# Patient Record
Sex: Male | Born: 1980 | Race: White | Hispanic: No | Marital: Single | Smoking: Current every day smoker
Health system: Southern US, Community
[De-identification: ages and names within clinical notes are randomized; demographics above are authoritative.]

## PROBLEM LIST (undated history)

## (undated) DIAGNOSIS — K219 Gastro-esophageal reflux disease without esophagitis: Secondary | ICD-10-CM

---

## 2005-08-05 ENCOUNTER — Emergency Department: Payer: Self-pay | Admitting: Internal Medicine

## 2005-08-06 ENCOUNTER — Ambulatory Visit: Payer: Self-pay | Admitting: Internal Medicine

## 2006-01-03 ENCOUNTER — Emergency Department: Payer: Self-pay | Admitting: Emergency Medicine

## 2006-01-09 ENCOUNTER — Emergency Department: Payer: Self-pay

## 2006-01-09 ENCOUNTER — Other Ambulatory Visit: Payer: Self-pay

## 2006-10-28 ENCOUNTER — Emergency Department: Payer: Self-pay | Admitting: Emergency Medicine

## 2006-11-21 ENCOUNTER — Inpatient Hospital Stay: Payer: Self-pay | Admitting: Internal Medicine

## 2007-03-23 ENCOUNTER — Emergency Department: Payer: Self-pay | Admitting: Emergency Medicine

## 2007-12-25 ENCOUNTER — Ambulatory Visit: Payer: Self-pay | Admitting: Family Medicine

## 2008-01-14 ENCOUNTER — Other Ambulatory Visit: Payer: Self-pay

## 2008-01-14 ENCOUNTER — Emergency Department: Payer: Self-pay | Admitting: Emergency Medicine

## 2008-07-14 ENCOUNTER — Emergency Department: Payer: Self-pay | Admitting: Unknown Physician Specialty

## 2008-09-10 ENCOUNTER — Emergency Department: Payer: Self-pay | Admitting: Internal Medicine

## 2008-11-07 ENCOUNTER — Emergency Department: Payer: Self-pay | Admitting: Emergency Medicine

## 2009-02-01 ENCOUNTER — Emergency Department: Payer: Self-pay | Admitting: Emergency Medicine

## 2009-10-01 ENCOUNTER — Emergency Department: Payer: Self-pay | Admitting: Emergency Medicine

## 2010-01-10 IMAGING — CR DG ABDOMEN 3V
1 series · 4 of 4 positions shown · non-contrast
Comparison: none

REASON FOR EXAM: abdominal pain vomiting
COMMENTS:

PROCEDURE:     DXR - DXR ABDOMEN 3-WAY (INCL PA CXR)  - September 10, 2008 [DATE]
RESULT:     The lungs are clear. The heart and pulmonary vessels are normal.
The bony and mediastinal structures are unremarkable. The bowel gas pattern
is normal. There is no obstruction or perforation evident.

[Series 1: view not recorded · 0.17mm/px · 4 of 4 slices shown]
[im 1/4]
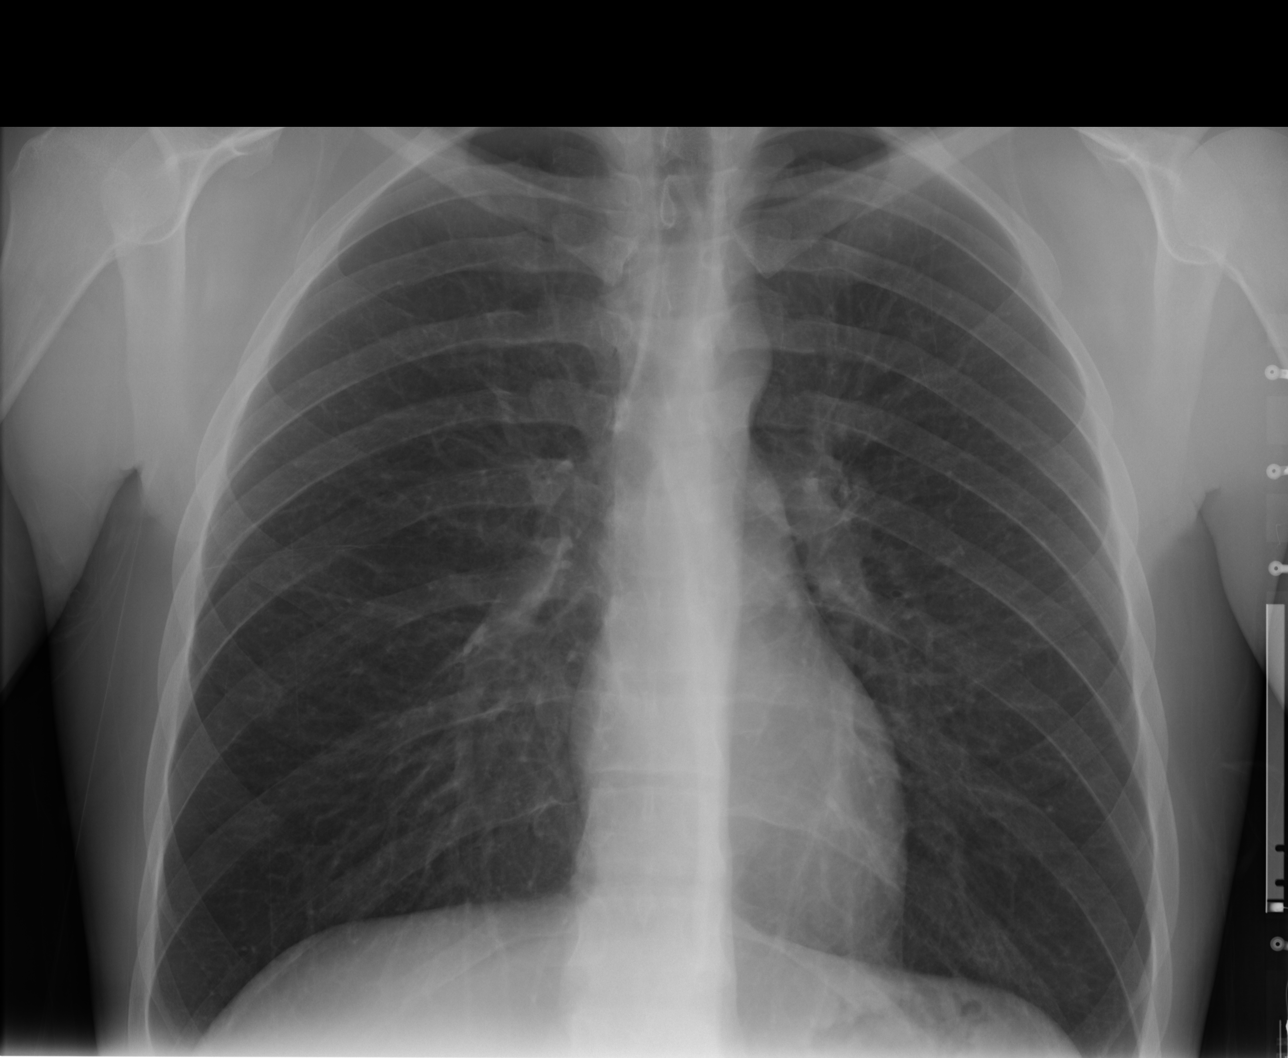
[im 2/4]
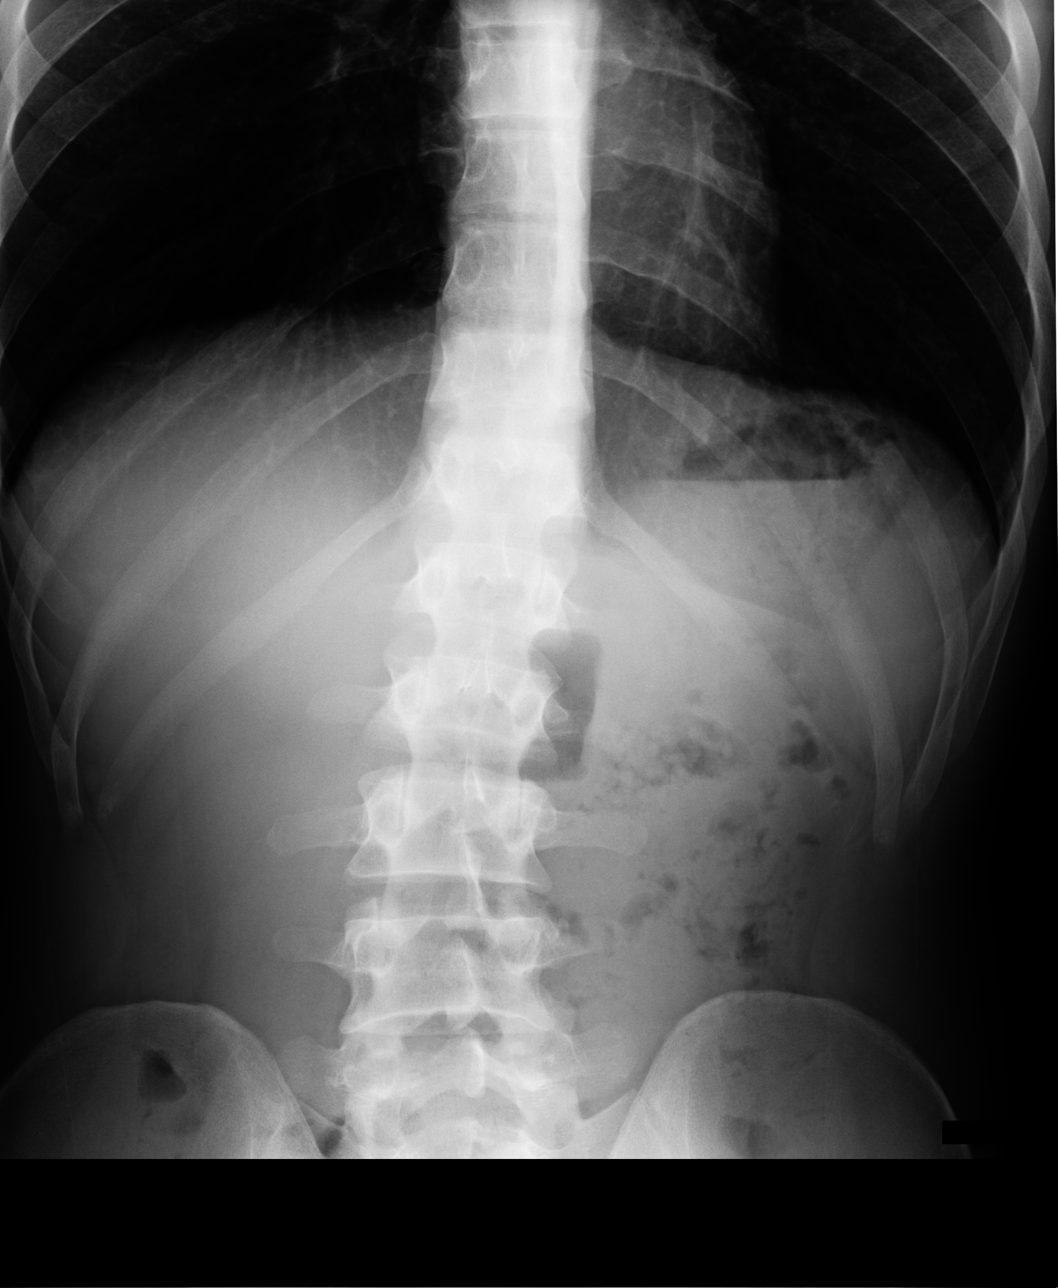
[im 3/4]
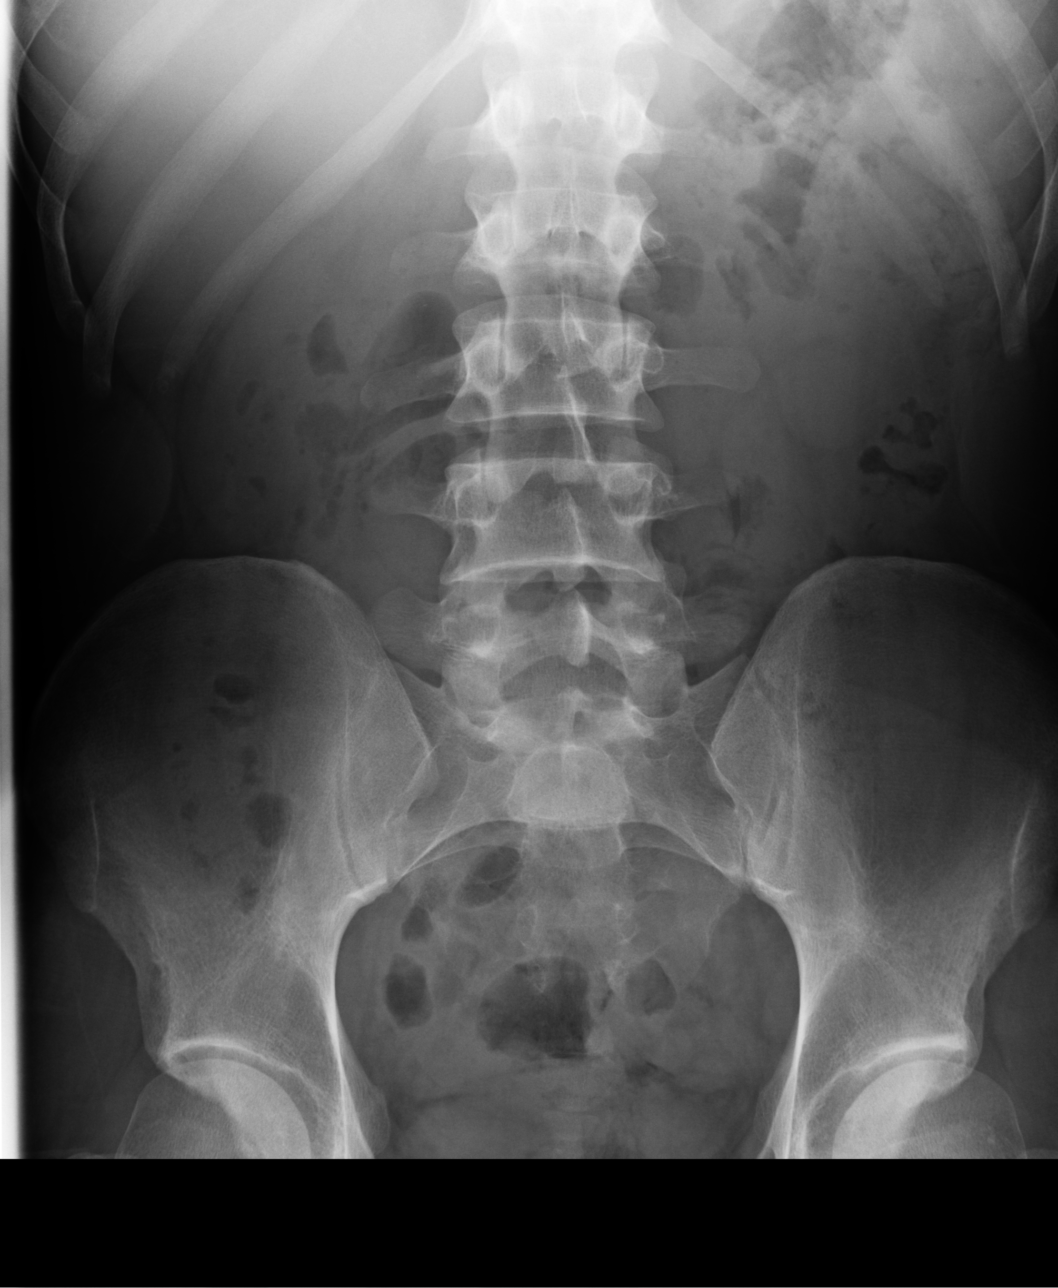
[im 4/4]
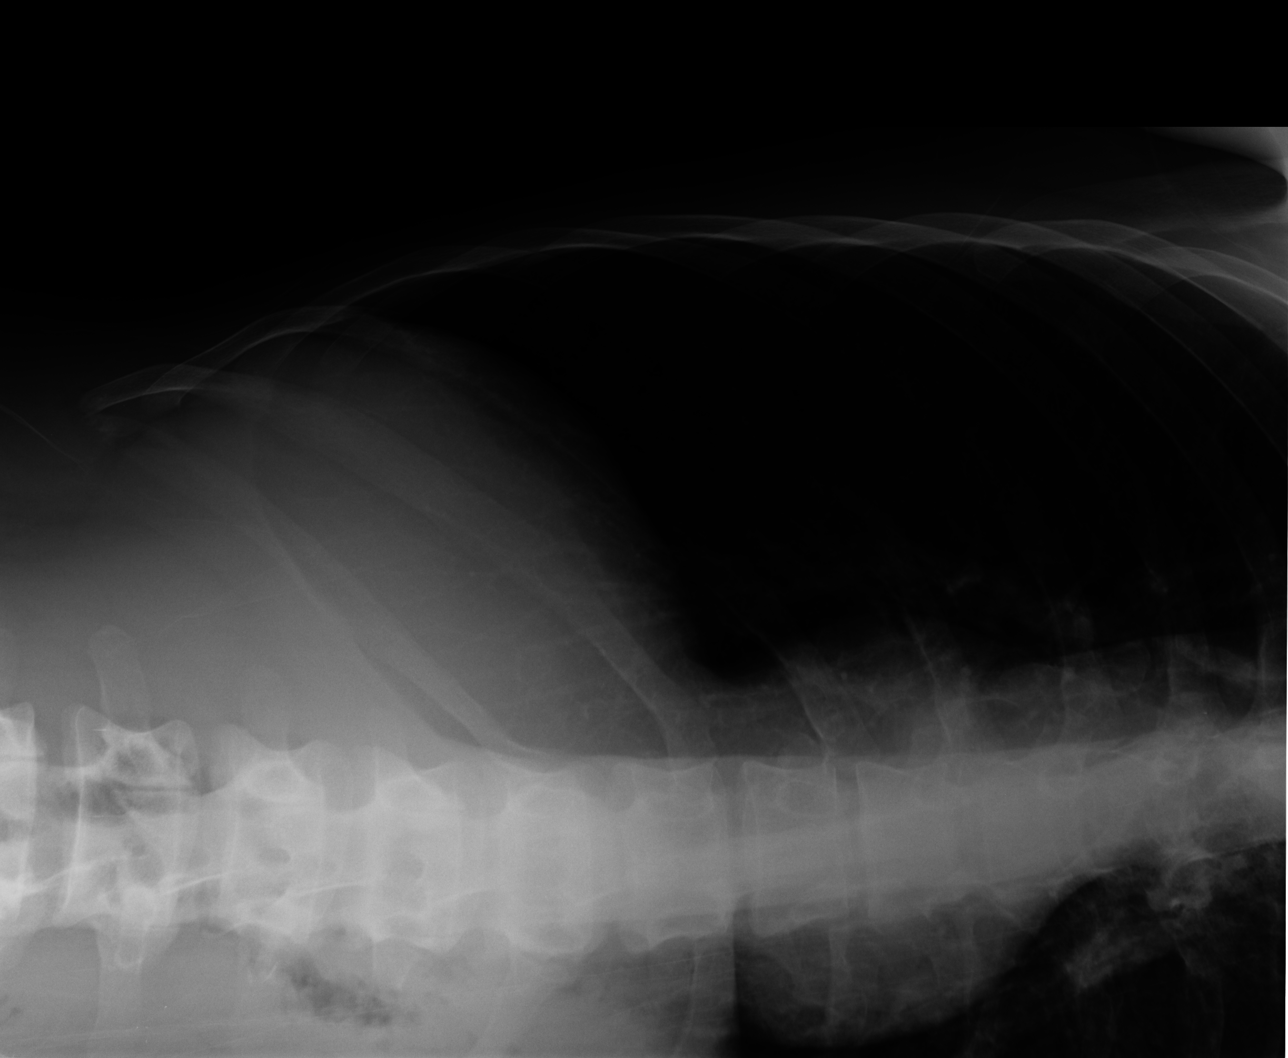

[4 of 4 positions shown; findings below may reference images not displayed]

IMPRESSION: No acute cardiopulmonary disease. No evidence of bowel
obstruction or perforation.

## 2010-06-04 IMAGING — CT CT ANGIOGRAPHY HEAD
1 of 5 series · 6 of 20 positions shown · IV contrast (isovue)
Comparison: none

REASON FOR EXAM: CT angiogram pt with bloody tap and had severe HA
yesterday
COMMENTS:

PROCEDURE:     CT  - CT ANGIOGRAPHY HEAD W/WO  - February 02, 2009 [DATE]
RESULT:     Study: CT angiogram of the circle-of-Willis dated
Comparisons: None
INDICATION: Severe headache. Bloody CSF.
TECHNIQUE: 100 mL of Isovue-O7C were administered and the intracranial
carotid arteries bilaterally were scanned during the arterial phase from the
vertex to the skull base.

[Series 6: 1mm soft tissue · axial · 0.38mm/px · z∈[+796,+907]mm · 6 of 157 slices shown]
[im 23/157  soft-tissue]
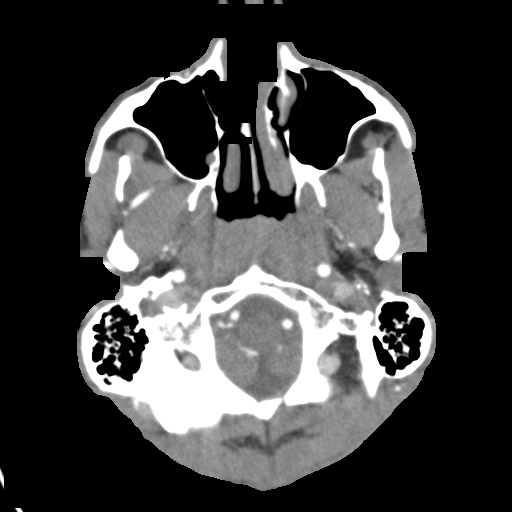
[im 45/157  bone]
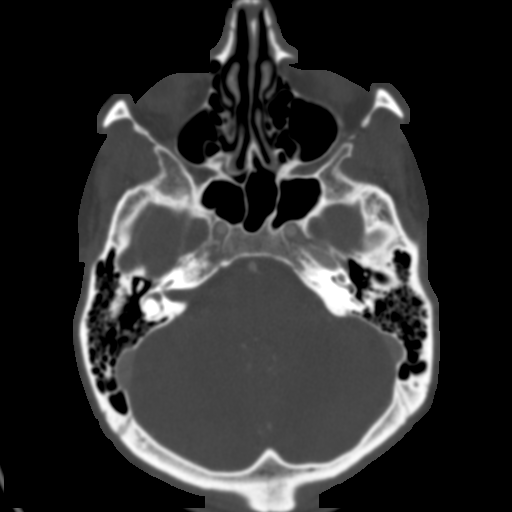
[im 67/157  soft-tissue]
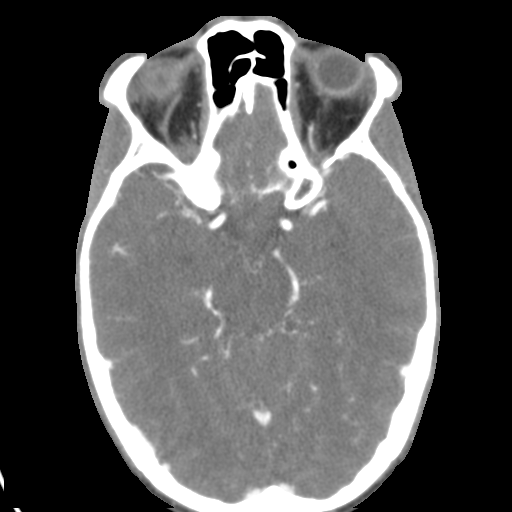
[im 90/157  bone]
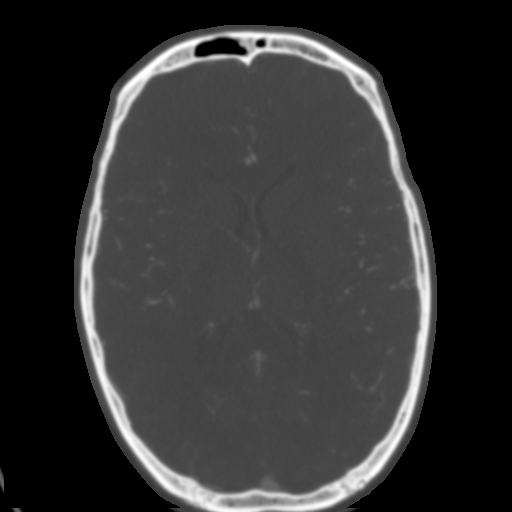
[im 112/157  soft-tissue]
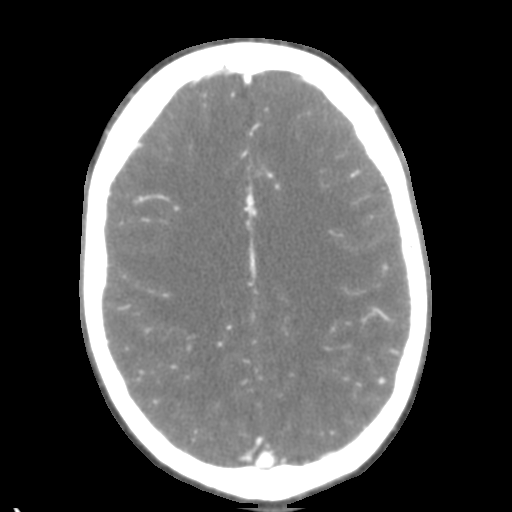
[im 134/157  bone]
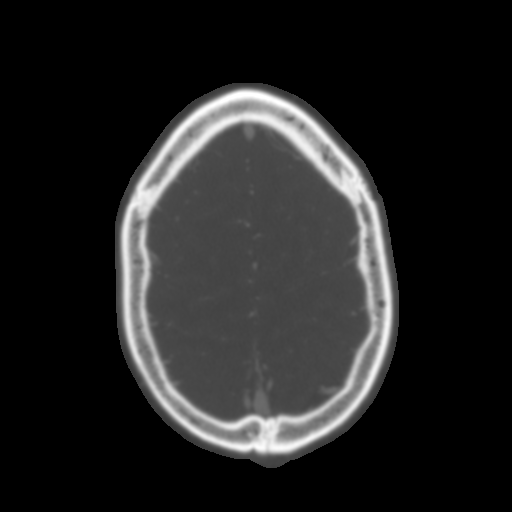

[6 of 20 positions shown; findings below may reference images not displayed]

FINDINGS: Intracranial circulation/circle-of-Willis:

Anterior: The internal carotid is identified and is patent bilaterally from
the skull base to the bifurcation into the middle cerebral and anterior
cerebral arteries. The anterior cerebral, anterior communicating, and
posterior communicating arteries are normal.

Posterior: The vertebral arteries are codominant and normal bilaterally from
the base of the skull to their confluence at the basilar artery. The
posterior cerebral arteries, superior cerebellar arteries, AICAs, and PICAs
are demonstrated and are unremarkable.

The visualized portions of the brain are unremarkable.
IMPRESSION: Normal circle of Willis.

No evidence of an intracranial aneurysm.

## 2010-08-15 ENCOUNTER — Emergency Department: Payer: Self-pay | Admitting: Emergency Medicine

## 2013-08-04 ENCOUNTER — Emergency Department: Payer: Self-pay | Admitting: Emergency Medicine

## 2014-05-25 ENCOUNTER — Emergency Department: Payer: Self-pay | Admitting: Emergency Medicine

## 2014-08-23 ENCOUNTER — Emergency Department: Payer: Self-pay | Admitting: Emergency Medicine

## 2015-02-24 ENCOUNTER — Encounter: Payer: Self-pay | Admitting: Emergency Medicine

## 2015-02-24 ENCOUNTER — Emergency Department
Admission: EM | Admit: 2015-02-24 | Discharge: 2015-02-24 | Disposition: A | Discharge status: Home / Self Care | Payer: Self-pay | Attending: Emergency Medicine | Admitting: Emergency Medicine

## 2015-02-24 DIAGNOSIS — K219 Gastro-esophageal reflux disease without esophagitis: Secondary | ICD-10-CM | POA: Insufficient documentation

## 2015-02-24 DIAGNOSIS — Z720 Tobacco use: Secondary | ICD-10-CM | POA: Insufficient documentation

## 2015-02-24 HISTORY — DX: Gastro-esophageal reflux disease without esophagitis: K21.9

## 2015-02-24 LAB — CBC
HEMATOCRIT: 46.6 % (ref 40.0–52.0)
HEMOGLOBIN: 15.7 g/dL (ref 13.0–18.0)
MCH: 29.1 pg (ref 26.0–34.0)
MCHC: 33.6 g/dL (ref 32.0–36.0)
MCV: 86.5 fL (ref 80.0–100.0)
Platelets: 202 10*3/uL (ref 150–440)
RBC: 5.39 MIL/uL (ref 4.40–5.90)
RDW: 13.7 % (ref 11.5–14.5)
WBC: 8.9 10*3/uL (ref 3.8–10.6)

## 2015-02-24 LAB — COMPREHENSIVE METABOLIC PANEL
ALT: 14 U/L — ABNORMAL LOW (ref 17–63)
AST: 18 U/L (ref 15–41)
Albumin: 4.4 g/dL (ref 3.5–5.0)
Alkaline Phosphatase: 78 U/L (ref 38–126)
Anion gap: 7 (ref 5–15)
BILIRUBIN TOTAL: 0.4 mg/dL (ref 0.3–1.2)
BUN: 9 mg/dL (ref 6–20)
CO2: 26 mmol/L (ref 22–32)
Calcium: 9.5 mg/dL (ref 8.9–10.3)
Chloride: 106 mmol/L (ref 101–111)
Creatinine, Ser: 1.12 mg/dL (ref 0.61–1.24)
GFR calc Af Amer: >60 mL/min (ref >60)
Glucose, Bld: 84 mg/dL (ref 65–99)
POTASSIUM: 4.3 mmol/L (ref 3.5–5.1)
Sodium: 139 mmol/L (ref 135–145)
TOTAL PROTEIN: 7.3 g/dL (ref 6.5–8.1)

## 2015-02-24 LAB — URINALYSIS COMPLETE WITH MICROSCOPIC (ARMC ONLY)
BACTERIA UA: NONE SEEN
Bilirubin Urine: NEGATIVE
Glucose, UA: NEGATIVE mg/dL
Hgb urine dipstick: NEGATIVE
Ketones, ur: NEGATIVE mg/dL
Leukocytes, UA: NEGATIVE
Nitrite: NEGATIVE
PROTEIN: NEGATIVE mg/dL
RBC / HPF: NONE SEEN RBC/hpf (ref 0–5)
SQUAMOUS EPITHELIAL / LPF: NONE SEEN
Specific Gravity, Urine: 1.005 (ref 1.005–1.030)
pH: 6 (ref 5.0–8.0)

## 2015-02-24 LAB — LIPASE, BLOOD: Lipase: 21 U/L — ABNORMAL LOW (ref 22–51)

## 2015-02-24 MED ORDER — OMEPRAZOLE 20 MG PO CPDR: 20.0000 mg | DELAYED_RELEASE_CAPSULE | Freq: Every day | ORAL | Status: DC

## 2015-02-24 NOTE — ED Provider Notes (Signed)
[facility] Emergency Department Provider Note  ____________________________________________  Time seen: On arrival  I have reviewed the triage vital signs and the nursing notes.   HISTORY  Chief Complaint Abdominal Pain    HPI Curtis Bryant is a 34 y.o. male who presents with complaints of GERD causing nausea. Patient reports a long history of GERD occasionally has difficulty affording his antiacid medication and when he is not taking it he frequently gets nauseous and sometimes he vomits to make himself feel better. He was seen and vomiting today at work and his boss sent him home and refused to let him work without a work note. Patient denies chest pain he denies abdominal pain. He feels well and has no real complaints except for his typical "acid feeling "    Past Medical History  Diagnosis Date  . GERD (gastroesophageal reflux disease)     There are no active problems to display for this patient.   History reviewed. No pertinent past surgical history.  Current Outpatient Rx  Name  Route  Sig  Dispense  Refill  . omeprazole (PRILOSEC) 20 MG capsule   Oral   Take 1 capsule (20 mg total) by mouth daily.   30 capsule   1     Allergies Review of patient's allergies indicates no known allergies.  No family history on file.  Social History Social History  Substance Use Topics  . Smoking status: Current Every Day Smoker  . Smokeless tobacco: None  . Alcohol Use: Yes    Review of Systems  Constitutional: Negative for fever.  ENT: Negative for sore throat  Abdominal: No pain, mild nausea Musculoskeletal: Negative for back pain. Skin: Negative for rash. Neurological: Negative for headaches or focal weakness   ____________________________________________   PHYSICAL EXAM:  VITAL SIGNS: ED Triage Vitals  Enc Vitals Group     BP --      Pulse Rate 02/24/15 0852 60     Resp 02/24/15 0852 16     Temp 02/24/15 0852 97.6 F (36.4  C)     Temp Source 02/24/15 0852 Oral     SpO2 02/24/15 0852 98 %     Weight 02/24/15 0852 155 lb (70.308 kg)     Height 02/24/15 0852  (1.93 m)     Head Cir --      Peak Flow --      Pain Score 02/24/15 0852 6     Pain Loc --      Pain Edu? --      Excl. in GC? --      Constitutional: Alert and oriented. Well appearing and in no distress. Eyes: Conjunctivae are normal.  ENT   Head: Normocephalic and atraumatic.   Mouth/Throat: Mucous membranes are moist. Cardiovascular: Normal rate, regular rhythm.  Respiratory: Normal respiratory effort without tachypnea nor retractions.  Gastrointestinal: Soft and non-tender in all quadrants. No distention. There is no CVA tenderness. Musculoskeletal: Nontender with normal range of motion in all extremities. Neurologic:  Normal speech and language. No gross focal neurologic deficits are appreciated. Skin:  Skin is warm, dry and intact. No rash noted. Psychiatric: Mood and affect are normal. Patient exhibits appropriate insight and judgment.  ____________________________________________    LABS (pertinent positives/negatives)  Labs Reviewed  LIPASE, BLOOD - Abnormal; Notable for the following:    Lipase 21 (*)    All other components within normal limits  COMPREHENSIVE METABOLIC PANEL - Abnormal; Notable for the following:  ALT 14 (*)    All other components within normal limits  URINALYSIS COMPLETEWITH MICROSCOPIC ([facility] ONLY) - Abnormal; Notable for the following:    Color, Urine STRAW (*)    APPearance CLEAR (*)    All other components within normal limits  CBC    ____________________________________________     ____________________________________________    RADIOLOGY I have personally reviewed any xrays that were ordered on this patient: None  ____________________________________________   PROCEDURES  Procedure(s) performed: none   ____________________________________________   INITIAL  IMPRESSION / ASSESSMENT AND PLAN / ED COURSE  Pertinent labs & imaging results that were available during my care of the patient were reviewed by me and considered in my medical decision making (see chart for details).  Patient well-appearing and in no distress. Vital signs are unremarkable. He has no abdominal tenderness to palpation. I printed out a good Rx coupon for Prilosec to help him afford his medications. I have written a work note to allow him to return to work today as he is well-appearing and I do not see any reason he cannot work  ____________________________________________   FINAL CLINICAL IMPRESSION(S) / ED DIAGNOSES  Final diagnoses:  Gastroesophageal reflux disease, esophagitis presence not specified     Jene Every, MD 02/24/15 [phone]

## 2015-02-24 NOTE — Discharge Instructions (Signed)
Gastroesophageal Reflux Disease, Adult °Gastroesophageal reflux disease (GERD) happens when acid from your stomach goes into your food pipe (esophagus). The acid can cause a burning feeling in your chest. Over time, the acid can make small holes (ulcers) in your food pipe.  °HOME CARE °· Ask your doctor for advice about: °¨ Losing weight. °¨ Quitting smoking. °¨ Alcohol use. °· Avoid foods and drinks that make your problems worse. You may want to avoid: °¨ Caffeine and alcohol. °¨ Chocolate. °¨ Mints. °¨ Garlic and onions. °¨ Spicy foods. °¨ Citrus fruits, such as oranges, lemons, or limes. °¨ Foods that contain tomato, such as sauce, chili, salsa, and pizza. °¨ Fried and fatty foods. °· Avoid lying down for 3 hours before you go to bed or before you take a nap. °· Eat small meals often, instead of large meals. °· Wear loose-fitting clothing. Do not wear anything tight around your waist. °· Raise (elevate) the head of your bed 6 to 8 inches with wood blocks. Using extra pillows does not help. °· Only take medicines as told by your doctor. °· Do not take aspirin or ibuprofen. °GET HELP RIGHT AWAY IF:  °· You have pain in your arms, neck, jaw, teeth, or back. °· Your pain gets worse or changes. °· You feel sick to your stomach (nauseous), throw up (vomit), or sweat (diaphoresis). °· You feel short of breath, or you pass out (faint). °· Your throw up is green, yellow, black, or looks like coffee grounds or blood. °· Your poop (stool) is red, bloody, or black. °MAKE SURE YOU:  °· Understand these instructions. °· Will watch your condition. °· Will get help right away if you are not doing well or get worse. °Document Released: 12/05/2007 Document Revised: 09/10/2011 Document Reviewed: 01/05/2011 °[employer]® Patient Information ©2015 [employer]. This information is not intended to replace advice given to you by your health care provider. Make sure you discuss any questions you have with your health care provider. ° °

## 2015-02-24 NOTE — ED Notes (Signed)
MD at bedside. 

## 2015-02-24 NOTE — ED Notes (Signed)
Says he has gerd and has rx for anti acid med.  He cannot afford it.  Says it started with upper abd pain Tuesday. Today is vomiting yellow emesis.  He vomited again at work today and his boss told him to go and get a doctors note.

## 2016-03-28 ENCOUNTER — Encounter: Payer: Self-pay | Admitting: *Deleted

## 2016-03-28 ENCOUNTER — Ambulatory Visit
Admission: EM | Admit: 2016-03-28 | Discharge: 2016-03-28 | Disposition: A | Discharge status: Home / Self Care | Payer: 59 | Attending: Family Medicine | Admitting: Family Medicine

## 2016-03-28 DIAGNOSIS — J984 Other disorders of lung: Secondary | ICD-10-CM

## 2016-03-28 DIAGNOSIS — K219 Gastro-esophageal reflux disease without esophagitis: Secondary | ICD-10-CM

## 2016-03-28 MED ORDER — FLUTICASONE PROPIONATE HFA 110 MCG/ACT IN AERO: 1.0000 | INHALATION_SPRAY | Freq: Two times a day (BID) | RESPIRATORY_TRACT | 12 refills | Status: DC

## 2016-03-28 MED ORDER — ESOMEPRAZOLE MAGNESIUM 40 MG PO CPDR: 40.0000 mg | DELAYED_RELEASE_CAPSULE | Freq: Two times a day (BID) | ORAL | 0 refills | Status: DC

## 2016-03-28 MED ORDER — ALBUTEROL SULFATE HFA 108 (90 BASE) MCG/ACT IN AERS: 2.0000 | INHALATION_SPRAY | Freq: Four times a day (QID) | RESPIRATORY_TRACT | 0 refills | Status: DC | PRN

## 2016-03-28 MED ORDER — IPRATROPIUM-ALBUTEROL 0.5-2.5 (3) MG/3ML IN SOLN
3.0000 mL | Freq: Once | RESPIRATORY_TRACT | Status: AC
  Administered 2016-03-28: 3 mL via RESPIRATORY_TRACT

## 2016-03-28 NOTE — ED Provider Notes (Signed)
CSN: [id number]     Arrival date & time 03/28/16  4098 History   First MD Initiated Contact with Patient 03/28/16 [phone]     Chief Complaint  Patient presents with  . Cough  . Sore Throat   (Consider location/radiation/quality/duration/timing/severity/associated sxs/prior Treatment) 35 year old male presents with burning in stomach, cough, chest congestion and sore throat for the past few months. Has been previously diagnosed with GERD and is suppose to be taking a PPI but has not taken any medication in over 2 years. Also went to [facility] 2 months ago and was placed on 2 inhalers for ?asthma/COPD but never got them filled. He did have a chest x-ray 2 months ago which pt says was unremarkable. He has smoked for almost 20 years and is not ready to quit.    The history is provided by the patient and a significant other.  Cough  Cough characteristics:  Productive Sputum characteristics:  Clear Severity:  Moderate Onset quality:  Gradual Duration:  12 weeks Timing:  Intermittent Progression:  Unchanged Chronicity:  New Smoker: yes   Context: occupational exposure and smoke exposure   Relieved by:  None tried Worsened by:  Smoking and environmental changes Ineffective treatments:  None tried Associated symptoms: shortness of breath, sore throat and wheezing   Associated symptoms: no chest pain, no chills, no diaphoresis, no ear fullness, no ear pain, no fever, no headaches, no myalgias, no rhinorrhea, no sinus congestion and no weight loss   Sore Throat  Associated symptoms include abdominal pain and shortness of breath. Pertinent negatives include no chest pain and no headaches.    Past Medical History:  Diagnosis Date  . GERD (gastroesophageal reflux disease)    History reviewed. No pertinent surgical history. History reviewed. No pertinent family history. Social History  Substance Use Topics  . Smoking status: Current Every Day Smoker  . Smokeless tobacco: Never Used   . Alcohol use Yes    Review of Systems  Constitutional: Negative for chills, diaphoresis, fever and weight loss.  HENT: Positive for sore throat. Negative for congestion, ear pain and rhinorrhea.   Respiratory: Positive for cough, shortness of breath and wheezing.   Cardiovascular: Negative for chest pain.  Gastrointestinal: Positive for abdominal pain, nausea and vomiting (occasional). Negative for blood in stool, constipation and diarrhea.  Genitourinary: Negative for difficulty urinating.  Musculoskeletal: Negative for arthralgias and myalgias.  Neurological: Negative for dizziness, weakness and headaches.  Hematological: Negative for adenopathy.    Allergies  Review of patient's allergies indicates no known allergies.  Home Medications   Prior to Admission medications   Medication Sig Start Date End Date Taking? Authorizing Provider  albuterol (PROVENTIL HFA;VENTOLIN HFA) 108 (90 Base) MCG/ACT inhaler Inhale 2 puffs into the lungs every 6 (six) hours as needed for wheezing or shortness of breath. 03/28/16   Sudie Grumbling, NP  esomeprazole (NEXIUM) 40 MG capsule Take 1 capsule (40 mg total) by mouth 2 (two) times daily before a meal. 03/28/16   Sudie Grumbling, NP  fluticasone (FLOVENT HFA) 110 MCG/ACT inhaler Inhale 1 puff into the lungs 2 (two) times daily. 03/28/16   Sudie Grumbling, NP   Meds Ordered and Administered this Visit   Medications  ipratropium-albuterol (DUONEB) 0.5-2.5 (3) MG/3ML nebulizer solution 3 mL (3 mLs Nebulization Given 03/28/16 0940)    BP 113/82 (BP Location: Left Arm)   Pulse 71   Temp 98.1 F (36.7 C) (Oral)   Resp 16   Ht  6' (1.829 m)   Wt 165 lb (74.8 kg)   SpO2 98%   BMI 22.38 kg/m  No data found.   Physical Exam  Constitutional: He is oriented to person, place, and time. He appears well-developed and well-nourished.  Appears fatigued  HENT:  Head: Normocephalic and atraumatic.  Right Ear: Hearing, tympanic membrane, external ear  and ear canal normal.  Left Ear: Hearing, tympanic membrane, external ear and ear canal normal.  Nose: Nose normal. Right sinus exhibits no maxillary sinus tenderness and no frontal sinus tenderness. Left sinus exhibits no maxillary sinus tenderness and no frontal sinus tenderness.  Mouth/Throat: Uvula is midline and mucous membranes are normal. Posterior oropharyngeal erythema present. No oropharyngeal exudate.  Neck: Normal range of motion. Neck supple.  Cardiovascular: Normal rate, regular rhythm and normal heart sounds.   Pulmonary/Chest: Effort normal. He has decreased breath sounds in the right upper field, the right middle field, the right lower field, the left upper field and the left lower field. He has wheezes in the right upper field, the right middle field, the right lower field, the left upper field and the left lower field. He has rhonchi in the right upper field, the right middle field, the right lower field, the left upper field and the left lower field.  Abdominal: Soft. Normal appearance and bowel sounds are normal. He exhibits no mass. There is no hepatosplenomegaly. There is tenderness in the epigastric area. There is no rigidity, no rebound, no guarding and no CVA tenderness.  Lymphadenopathy:    He has no cervical adenopathy.  Neurological: He is alert and oriented to person, place, and time.  Skin: Skin is warm and dry. Capillary refill takes less than 2 seconds.  Psychiatric: He has a normal mood and affect. His behavior is normal. Judgment and thought content normal.    Urgent Care Course   Clinical Course    Procedures (including critical care time)  Labs Review Labs Reviewed - No data to display  Imaging Review No results found.   Visual Acuity Review  Right Eye Distance:   Left Eye Distance:   Bilateral Distance:    Right Eye Near:   Left Eye Near:    Bilateral Near:         MDM   1. Gastroesophageal reflux disease, esophagitis presence not  specified   2. Restrictive airway disease    Provided Duoneb in clinic- patient was able to take deeper breaths and chest tightness improved. Upon exam, wheezes now only heard in lower fields and minimal rhonchi heard. Patient expressed gratitude on being able to take a deeper breath for the first time in months.  Discussed with patient and significant other that he has GERD which can cause coughing and sore throat if uncontrolled as well as he may have asthma/COPD. Did not repeat chest x-ray since patient had x-ray 2 months ago and no new symptoms have appeared- just worsening of symptoms.  Discussed importance of staying on  PPI- start Nexium 40mg  twice a day for 2 weeks then once daily.  Recommend he start Flovent 1 puff twice a day. Use Albuterol inhaler 2 puffs every 6 hours as needed.  Discussed that he needs to be established with a PCP for management and care of his health conditions and to perform testing to confirm asthma or COPD. He also will need assistance with smoking cessation. Patient understands and will try to establish care in [place]. Recommend he follow-up with a PCP in 1 week or  go to ER if symptoms worsen.     Sudie GrumblingAnn Berry Chelci [place], NP 03/29/16 [phone]

## 2016-03-28 NOTE — ED Triage Notes (Signed)
Productive cough- clear, chest congestion, sore throat, x1 month. Denies fever.

## 2016-03-31 ENCOUNTER — Telehealth: Payer: Self-pay

## 2016-03-31 NOTE — Telephone Encounter (Signed)
Courtesy call back completed today for patient's recent visit at [facility]. Patient did not answer, left message on machine to call back with any questions or concerns.   

## 2018-07-07 ENCOUNTER — Ambulatory Visit
Admission: EM | Admit: 2018-07-07 | Discharge: 2018-07-07 | Disposition: A | Discharge status: Home / Self Care | Payer: BLUE CROSS/BLUE SHIELD | Attending: Family Medicine | Admitting: Family Medicine

## 2018-07-07 ENCOUNTER — Other Ambulatory Visit: Payer: Self-pay

## 2018-07-07 ENCOUNTER — Encounter: Payer: Self-pay | Admitting: Emergency Medicine

## 2018-07-07 DIAGNOSIS — B9789 Other viral agents as the cause of diseases classified elsewhere: Secondary | ICD-10-CM | POA: Diagnosis not present

## 2018-07-07 DIAGNOSIS — R062 Wheezing: Secondary | ICD-10-CM | POA: Diagnosis not present

## 2018-07-07 DIAGNOSIS — J069 Acute upper respiratory infection, unspecified: Secondary | ICD-10-CM | POA: Insufficient documentation

## 2018-07-07 MED ORDER — BENZONATATE 100 MG PO CAPS: 100.0000 mg | ORAL_CAPSULE | Freq: Three times a day (TID) | ORAL | 0 refills | Status: AC | PRN

## 2018-07-07 MED ORDER — IPRATROPIUM BROMIDE 0.06 % NA SOLN: 2.0000 | Freq: Four times a day (QID) | NASAL | 0 refills | Status: AC | PRN

## 2018-07-07 MED ORDER — CETIRIZINE-PSEUDOEPHEDRINE ER 5-120 MG PO TB12: 1.0000 | ORAL_TABLET | Freq: Two times a day (BID) | ORAL | 0 refills | Status: AC

## 2018-07-07 NOTE — ED Provider Notes (Signed)
[facility]    CSN: [id number] Arrival date & time: 07/07/18  1403  History   Chief Complaint Chief Complaint  Patient presents with  . Cough  . Nasal Congestion   HPI  38 year old male presents with upper respiratory symptoms.  Patient reports that his symptoms started on Saturday.  He reports cough, postnasal drip, sore throat.  No fever.  Mild in severity. No reported sick contacts.  Significant other states that he has had some congestion as well.  No medications or interventions tried.  No known exacerbating factors.  No other associated symptoms other complaints.  Hx reviewed as below. Past Medical History:  Diagnosis Date  . GERD (gastroesophageal reflux disease)    Social History Social History   Tobacco Use  . Smoking status: Current Every Day Smoker  . Smokeless tobacco: Never Used  Substance Use Topics  . Alcohol use: Yes  . Drug use: Not on file     Allergies   Patient has no known allergies.   Review of Systems Review of Systems  Constitutional: Negative for fever.  HENT: Positive for rhinorrhea and sore throat.   Respiratory: Positive for cough.    Physical Exam Triage Vital Signs ED Triage Vitals  Enc Vitals Group     BP 07/07/18 1448 115/84     Pulse Rate 07/07/18 1448 65     Resp 07/07/18 1448 18     Temp 07/07/18 1448 98.4 F (36.9 C)     Temp Source 07/07/18 1448 Oral     SpO2 07/07/18 1448 99 %     Weight 07/07/18 1446 150 lb (68 kg)     Height 07/07/18 1446 6' (1.829 m)     Head Circumference --      Peak Flow --      Pain Score 07/07/18 1446 0     Pain Loc --      Pain Edu? --      Excl. in GC? --    Updated Vital Signs BP 115/84 (BP Location: Left Arm)   Pulse 65   Temp 98.4 F (36.9 C) (Oral)   Resp 18   Ht 6' (1.829 m)   Wt 68 kg   SpO2 99%   BMI 20.34 kg/m   Visual Acuity Right Eye Distance:   Left Eye Distance:   Bilateral Distance:    Right Eye Near:   Left Eye Near:    Bilateral Near:      Physical Exam Vitals signs and nursing note reviewed.  Constitutional:      General: He is not in acute distress. HENT:     Head: Normocephalic and atraumatic.     Right Ear: Tympanic membrane normal.     Left Ear: Tympanic membrane normal.     Nose: Nose normal.     Mouth/Throat:     Comments: Oropharynx with erythema.  No exudates. Eyes:     General:        Right eye: No discharge.        Left eye: No discharge.     Conjunctiva/sclera: Conjunctivae normal.  Neck:     Musculoskeletal: Neck supple.  Cardiovascular:     Rate and Rhythm: Normal rate and regular rhythm.  Pulmonary:     Effort: Pulmonary effort is normal.     Breath sounds: Wheezing present.  Lymphadenopathy:     Cervical: No cervical adenopathy.  Neurological:     Mental Status: He is alert.  Psychiatric:  Mood and Affect: Mood normal.        Behavior: Behavior normal.    UC Treatments / Results  Labs (all labs ordered are listed, but only abnormal results are displayed) Labs Reviewed - No data to display  EKG None  Radiology No results found.  Procedures Procedures (including critical care time)  Medications Ordered in UC Medications - No data to display  Initial Impression / Assessment and Plan / UC Course  I have reviewed the triage vital signs and the nursing notes.  Pertinent labs & imaging results that were available during my care of the patient were reviewed by me and considered in my medical decision making (see chart for details).    38 year old male presents with a viral URI with cough.  Treating with Tessalon Perles, Zyrtec-D, Atrovent nasal spray.  Final Clinical Impressions(s) / UC Diagnoses   Final diagnoses:  Viral URI with cough   Discharge Instructions   None    ED Prescriptions    Medication Sig Dispense Auth. Provider   benzonatate (TESSALON) 100 MG capsule Take 1 capsule (100 mg total) by mouth 3 (three) times daily as needed. 30 capsule Braeleigh Pyper G, DO    cetirizine-pseudoephedrine (ZYRTEC-D) 5-120 MG tablet Take 1 tablet by mouth 2 (two) times daily. 30 tablet Renleigh Ouellet G, DO   ipratropium (ATROVENT) 0.06 % nasal spray Place 2 sprays into both nostrils 4 (four) times daily as needed for rhinitis. 15 mL Tommie Sams, DO     Controlled Substance Prescriptions [place] Controlled Substance Registry consulted? Not Applicable   Tommie Sams, DO 07/07/18 1609

## 2018-07-07 NOTE — ED Triage Notes (Signed)
Patient c/o cough and nasal congestion that started on Saturday. Patient has not taken any OTC medications for his symptoms.
